# Patient Record
Sex: Male | Born: 1935 | Race: Black or African American | Hispanic: No | Marital: Single
Health system: Southern US, Community
[De-identification: ages and names within clinical notes are randomized; demographics above are authoritative.]

## PROBLEM LIST (undated history)

## (undated) DIAGNOSIS — I2510 Atherosclerotic heart disease of native coronary artery without angina pectoris: Secondary | ICD-10-CM

## (undated) DIAGNOSIS — G20 Parkinson's disease: Secondary | ICD-10-CM

## (undated) DIAGNOSIS — K219 Gastro-esophageal reflux disease without esophagitis: Secondary | ICD-10-CM

## (undated) DIAGNOSIS — R0681 Apnea, not elsewhere classified: Secondary | ICD-10-CM

## (undated) DIAGNOSIS — E785 Hyperlipidemia, unspecified: Secondary | ICD-10-CM

## (undated) DIAGNOSIS — N200 Calculus of kidney: Secondary | ICD-10-CM

## (undated) DIAGNOSIS — I10 Essential (primary) hypertension: Secondary | ICD-10-CM

## (undated) HISTORY — PX: CARDIAC CATHETERIZATION: SHX172

## (undated) HISTORY — PX: LITHOTRIPSY: SUR834

---

## 2004-05-27 ENCOUNTER — Ambulatory Visit: Payer: Self-pay | Admitting: Internal Medicine

## 2004-06-17 ENCOUNTER — Ambulatory Visit: Payer: Self-pay | Admitting: Internal Medicine

## 2004-07-10 ENCOUNTER — Ambulatory Visit: Payer: Self-pay | Admitting: Internal Medicine

## 2004-09-03 ENCOUNTER — Ambulatory Visit: Payer: Self-pay | Admitting: Internal Medicine

## 2005-01-01 ENCOUNTER — Ambulatory Visit: Payer: Self-pay | Admitting: Internal Medicine

## 2005-08-19 ENCOUNTER — Ambulatory Visit: Payer: Self-pay | Admitting: Internal Medicine

## 2005-10-09 ENCOUNTER — Ambulatory Visit: Payer: Self-pay | Admitting: Internal Medicine

## 2005-10-29 ENCOUNTER — Ambulatory Visit: Payer: Self-pay | Admitting: Pulmonary Disease

## 2005-11-05 ENCOUNTER — Ambulatory Visit: Payer: Self-pay | Admitting: Internal Medicine

## 2005-11-27 ENCOUNTER — Ambulatory Visit: Payer: Self-pay | Admitting: Internal Medicine

## 2006-01-29 ENCOUNTER — Ambulatory Visit: Payer: Self-pay | Admitting: Pulmonary Disease

## 2006-03-12 ENCOUNTER — Ambulatory Visit: Payer: Self-pay | Admitting: Internal Medicine

## 2006-04-22 ENCOUNTER — Ambulatory Visit: Payer: Self-pay

## 2006-05-25 ENCOUNTER — Ambulatory Visit (HOSPITAL_COMMUNITY): Admission: RE | Admit: 2006-05-25 | Discharge: 2006-05-25 | Payer: Self-pay | Admitting: Internal Medicine

## 2006-05-25 ENCOUNTER — Ambulatory Visit: Payer: Self-pay | Admitting: Internal Medicine

## 2006-05-25 LAB — CONVERTED CEMR LAB
Hemoglobin, Urine: NEGATIVE
Ketones, ur: NEGATIVE mg/dL
Leukocytes, UA: NEGATIVE
Mucus, UA: NEGATIVE
PSA: 0.44 ng/mL
Total Protein, Urine: NEGATIVE mg/dL
Urine Glucose: NEGATIVE mg/dL
Urobilinogen, UA: 0.2 (ref 0.0–1.0)

## 2006-05-28 ENCOUNTER — Ambulatory Visit: Payer: Self-pay | Admitting: Internal Medicine

## 2006-05-28 ENCOUNTER — Ambulatory Visit: Payer: Self-pay | Admitting: Cardiology

## 2006-06-08 ENCOUNTER — Encounter: Admission: RE | Admit: 2006-06-08 | Discharge: 2006-09-06 | Payer: Self-pay | Admitting: Internal Medicine

## 2006-06-15 ENCOUNTER — Ambulatory Visit: Payer: Self-pay | Admitting: Internal Medicine

## 2006-07-01 ENCOUNTER — Ambulatory Visit (HOSPITAL_BASED_OUTPATIENT_CLINIC_OR_DEPARTMENT_OTHER): Admission: RE | Admit: 2006-07-01 | Discharge: 2006-07-01 | Payer: Self-pay | Admitting: Pulmonary Disease

## 2006-07-10 ENCOUNTER — Ambulatory Visit: Payer: Self-pay | Admitting: Pulmonary Disease

## 2006-08-05 ENCOUNTER — Ambulatory Visit: Payer: Self-pay | Admitting: Internal Medicine

## 2006-10-14 ENCOUNTER — Ambulatory Visit: Payer: Self-pay | Admitting: Pulmonary Disease

## 2006-10-20 ENCOUNTER — Encounter: Payer: Self-pay | Admitting: Internal Medicine

## 2006-10-20 DIAGNOSIS — I10 Essential (primary) hypertension: Secondary | ICD-10-CM | POA: Insufficient documentation

## 2006-10-20 DIAGNOSIS — E669 Obesity, unspecified: Secondary | ICD-10-CM | POA: Insufficient documentation

## 2006-10-20 DIAGNOSIS — Z87898 Personal history of other specified conditions: Secondary | ICD-10-CM | POA: Insufficient documentation

## 2006-10-20 DIAGNOSIS — G4730 Sleep apnea, unspecified: Secondary | ICD-10-CM | POA: Insufficient documentation

## 2007-04-23 ENCOUNTER — Telehealth (INDEPENDENT_AMBULATORY_CARE_PROVIDER_SITE_OTHER): Payer: Self-pay | Admitting: *Deleted

## 2007-07-26 ENCOUNTER — Telehealth (INDEPENDENT_AMBULATORY_CARE_PROVIDER_SITE_OTHER): Payer: Self-pay | Admitting: *Deleted

## 2008-04-27 ENCOUNTER — Telehealth (INDEPENDENT_AMBULATORY_CARE_PROVIDER_SITE_OTHER): Payer: Self-pay | Admitting: *Deleted

## 2009-03-01 IMAGING — CR DG LUMBAR SPINE COMPLETE 4+V
5 series · 5 of 5 positions shown · non-contrast
Comparison: none

CLINICAL DATA: Severe low back pain for five days.
 LUMBAR SPINE ? 4 VIEW:

[view not recorded (1 of 5)]
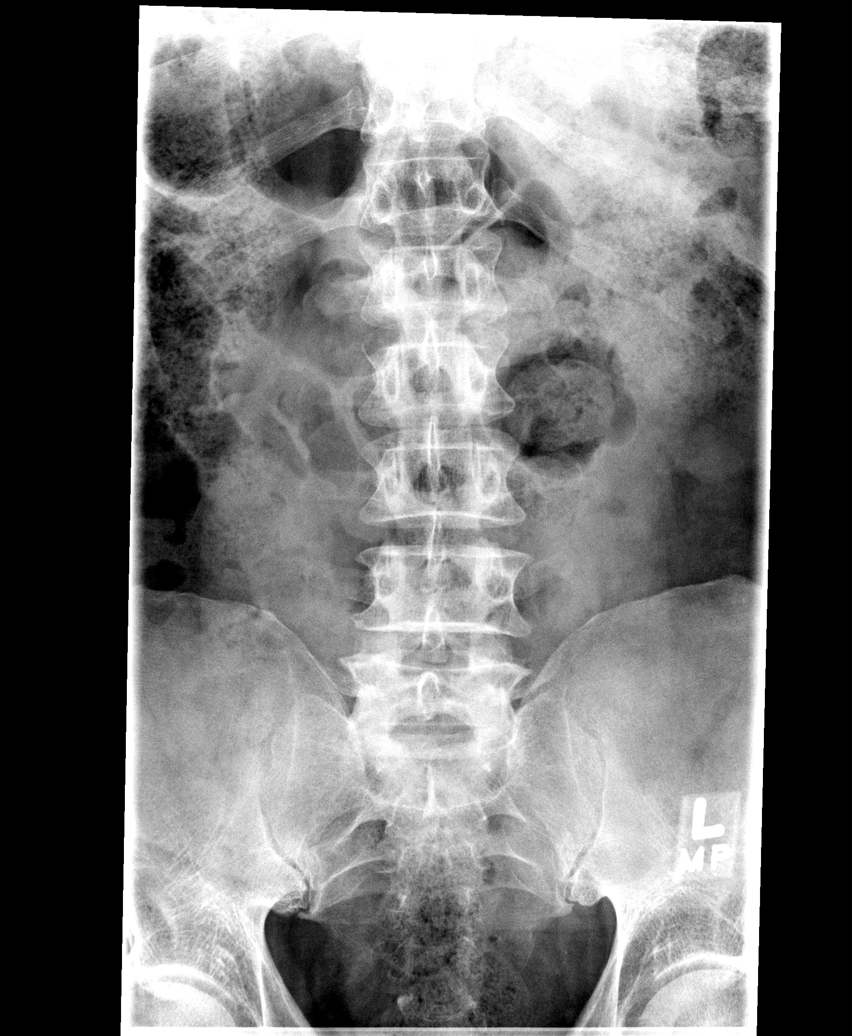

[view not recorded (2 of 5)]
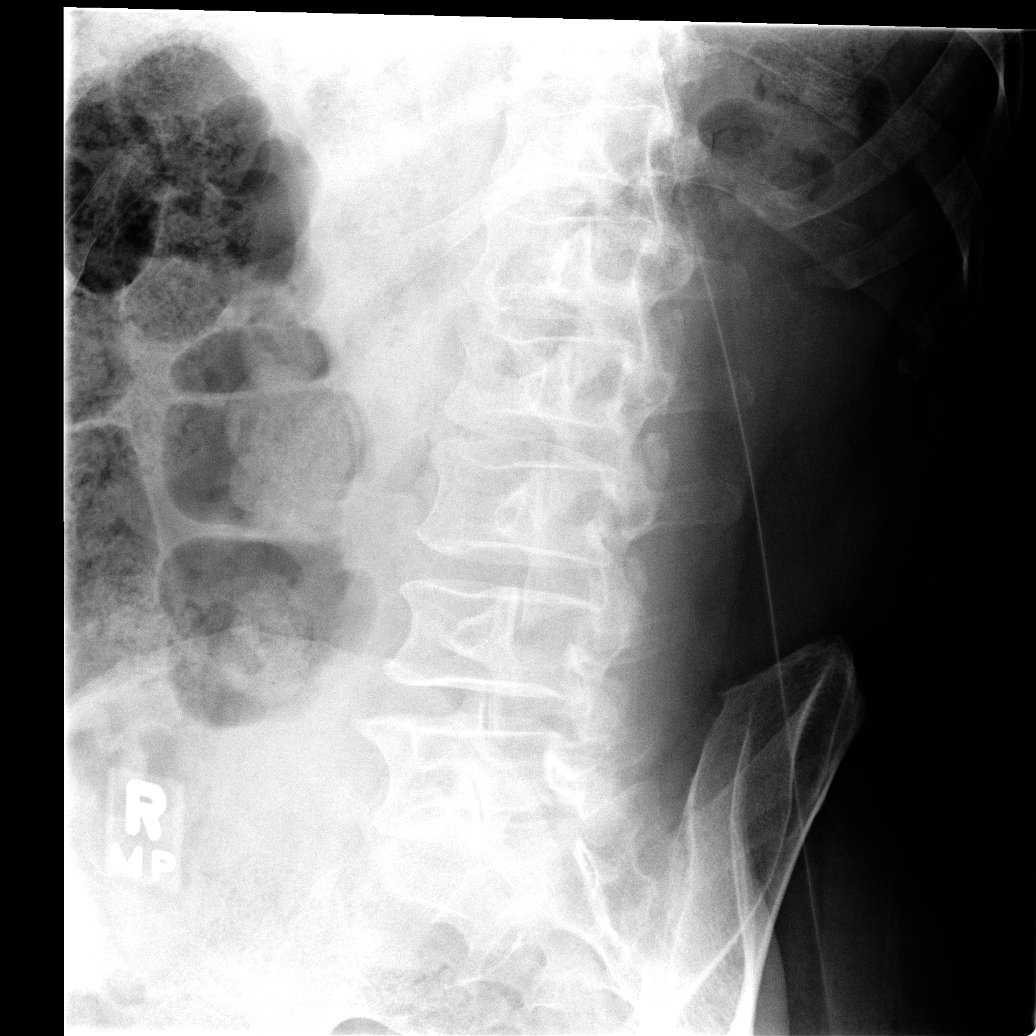

[view not recorded (3 of 5)]
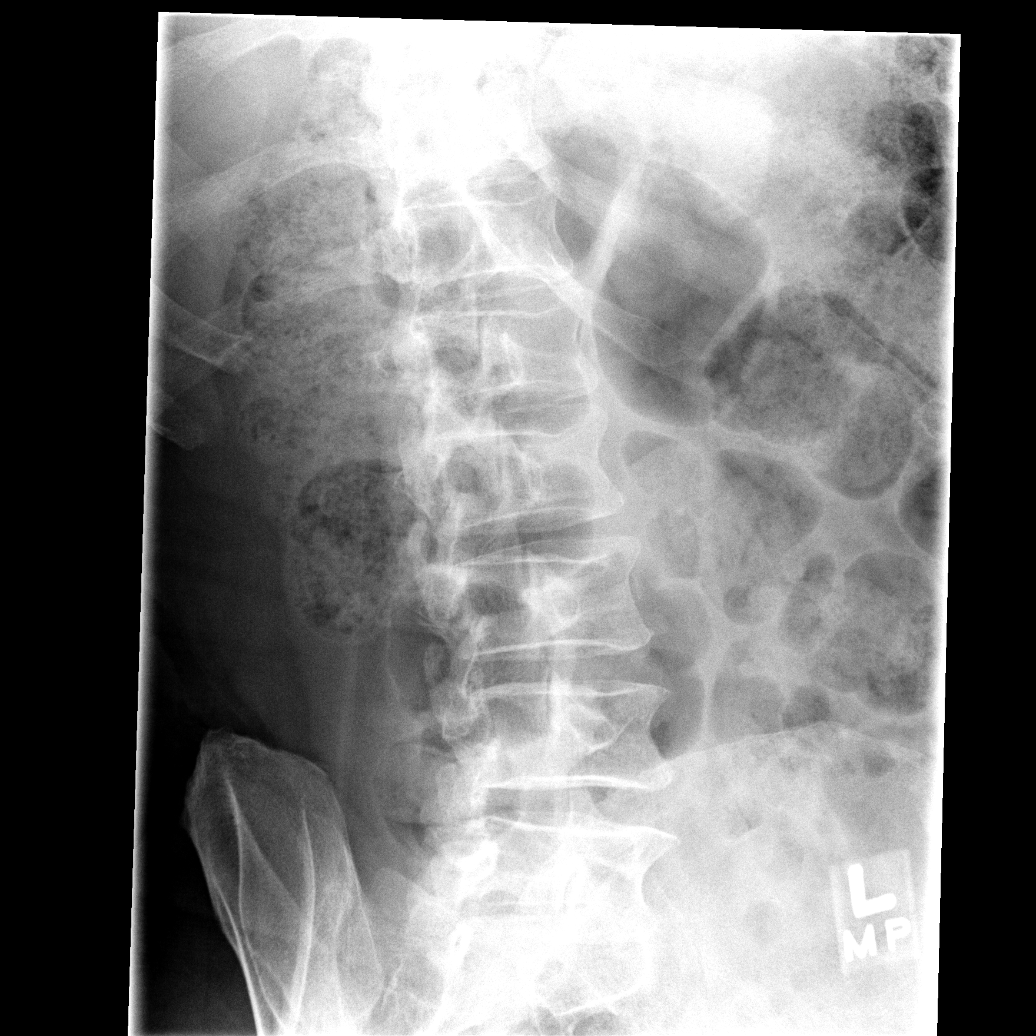

[view not recorded (4 of 5)]
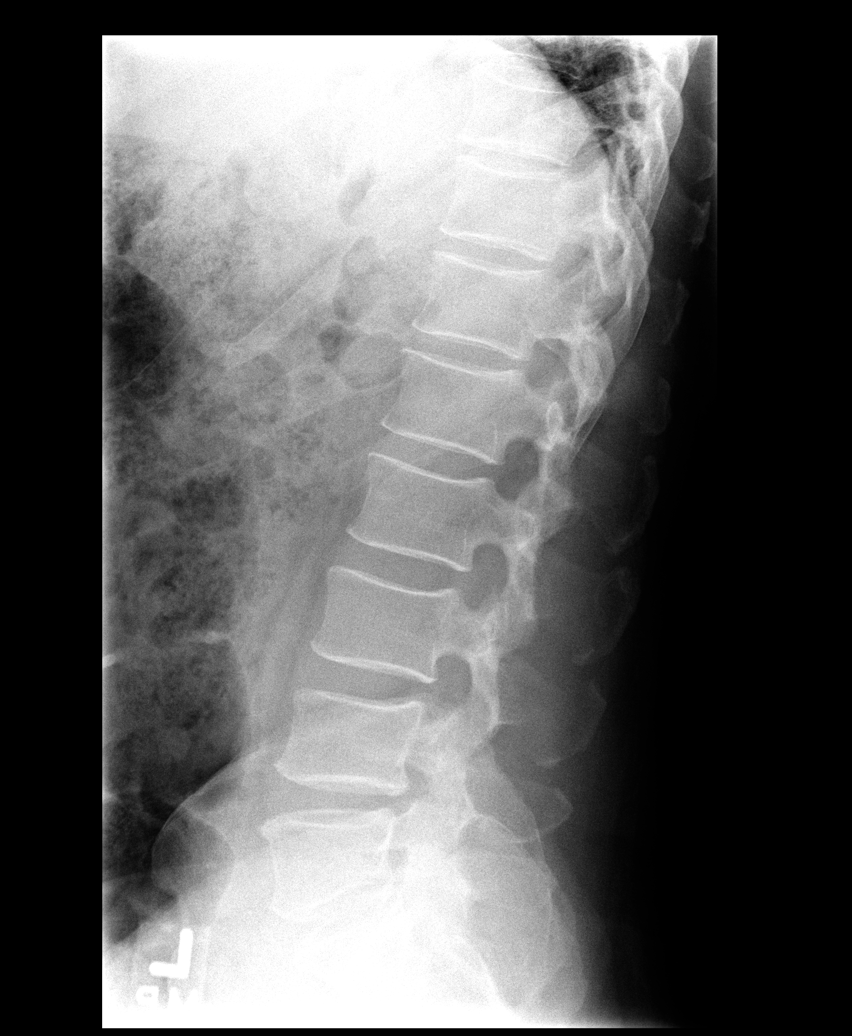

[view not recorded (5 of 5)]
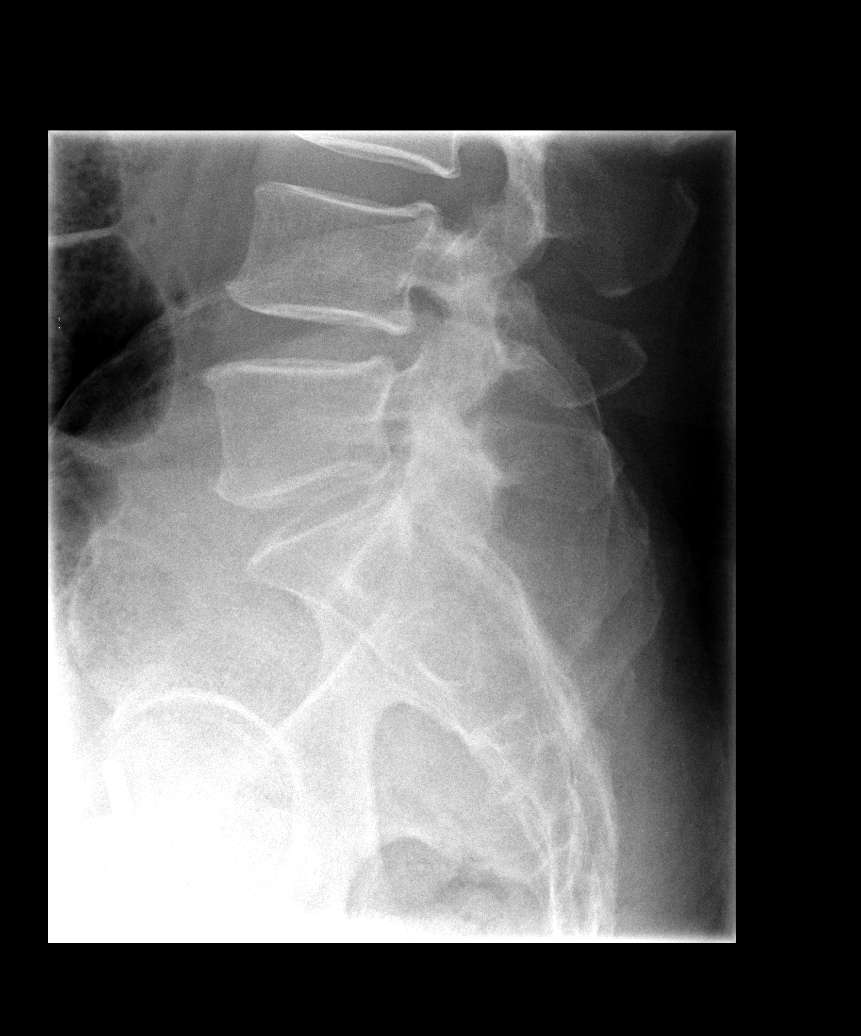

[5 of 5 positions shown; findings below may reference images not displayed]

FINDINGS: There are five typical lumbar segments with no spondylolisthesis, spondylolysis or significant disc space narrowing.  There is no fracture or subluxation.
IMPRESSION: Normal lumbar spine.

## 2009-04-16 ENCOUNTER — Encounter: Payer: Self-pay | Admitting: Pulmonary Disease

## 2009-11-29 ENCOUNTER — Encounter: Payer: Self-pay | Admitting: Pulmonary Disease

## 2010-03-22 ENCOUNTER — Encounter: Payer: Self-pay | Admitting: Pulmonary Disease

## 2010-04-23 NOTE — Letter (Signed)
Summary: CMN for PAP Supplies/[facility]  CMN for PAP Supplies/[facility]   Imported By: Sherian Rein 04/19/2009 08:59:48  _____________________________________________________________________  External Attachment:    Type:   Image     Comment:   External Document

## 2010-04-23 NOTE — Letter (Signed)
Summary: Denied CMN/[facility]  Denied CMN/[facility]   Imported By: Lester Weatogue 12/06/2009 12:01:46  _____________________________________________________________________  External Attachment:    Type:   Image     Comment:   External Document

## 2010-04-25 NOTE — Letter (Signed)
Summary: DME/[facility]  DME/[facility]   Imported By: Sherian Rein 04/03/2010 14:29:04  _____________________________________________________________________  External Attachment:    Type:   Image     Comment:   External Document

## 2010-08-06 NOTE — Assessment & Plan Note (Signed)
[facility]                            CARDIOLOGY OFFICE NOTE   Joseph Silva, Joseph Silva                         MRN:          [id number]  DATE:08/05/2006                            DOB:          14-Oct-1933    PRIMARY CARE PHYSICIAN:  Joseph Hair. Artist Pais, DO.   INTERVAL HISTORY:  [name] is a delightful 77 year old male with a  history of hypertension, hyperlipidemia, obesity, and sleep apnea, on  CPAP, who returns for routine followup.   Overall, he is doing quite well.  He denies any chest pain or shortness  of breath.  The main problem has been that he developed some left leg  pain which is concerning for his sciatica.  He has therefore been unable  to engage in his regular exercise routine.  For about the past two  months, he has been seeing a [facility].  Fortunately, the pain is  getting better.  He denies any orthopnea.  No PND.  His cholesterol has  been well controlled.   CURRENT MEDICATIONS:  1. Potassium.  2. Altace 10 a day.  3. Hydrochlorothiazide 25 a day.  4. Aspirin 325 a day.  5. Zocor 20 a day.  6. Viagra p.r.n.   PHYSICAL EXAMINATION:  GENERAL:  He is well-appearing.  Ambulates around  the clinic with a cane due to his left leg problem.  No respiratory  difficulty.  VITAL SIGNS:  Blood pressure 126/72, checked manually.  Heart rate is  76.  Weight is 227, which is down about 10 pounds from previous.  HEENT:  Normal.  NECK:  Supple.  No JVD.  Carotids are 2+ bilaterally without any bruits.  There is no lymphadenopathy or thyromegaly.  CARDIAC:  PMI is nondisplaced.  Regular rate and rhythm.  No murmurs,  rubs or gallops.  LUNGS:  Clear.  ABDOMEN:  Mildly obese, nontender, nondistended.  No hepatosplenomegaly.  No bruits.  No masses.  Good bowel sounds.  EXTREMITIES:  Warm with no clubbing, cyanosis or edema.  There is no  rash.  NEURO:  He is alert and oriented x3.  Cranial nerves II-XII are intact.  He moves all four  extremities without difficulty.  Affect is very  pleasant.   ASSESSMENT/PLAN:  1. Hypertension:  This is well controlled.  Continue current therapy.  2. Hyperlipidemia:  The most recent cholesterol panel looked great.      He was at goal.  3. Obesity:  He has lost about 10 pounds since we last saw him.  We      congratulated him and told him to continue with his exercise and      diet program.  4. Cardiovascular risk screening:  He had a Myoview in January, 2008      which showed an EF of 55% with no evidence of scar or ischemia.   DISPOSITION:  He is doing quite well.  Return to clinic in six months  for routine followup.     Bevelyn Buckles. Bensimhon, MD     DRB/MedQ  DD: 08/05/2006  DT: 08/05/2006  Job #: [id number]

## 2010-08-09 NOTE — Procedures (Signed)
NAME:  Joseph Silva, Joseph Silva NO.:  [id number]   MEDICAL RECORD NO.:  [id number]          PATIENT TYPE:  OUT   LOCATION:  SLEEP CENTER                 FACILITY:  [facility]   PHYSICIAN:  Barbaraann Share, MD,FCCPDATE OF BIRTH:  10/05/35   DATE OF STUDY:  07/01/2006                            NOCTURNAL POLYSOMNOGRAM   REFERRING PHYSICIAN:   INDICATIONS FOR STUDY:  Hypersomnia with sleep apnea.  The patient  returns for pressure optimization.   EPWORTH SLEEPINESS SCORE:  Is 11.   MEDICATIONS:   SLEEP ARCHITECTURE:  The patient had a total sleep time of 321 minutes  with significantly decreased REM and never achieved slow wave sleep.  Sleep onset latency was normal at 13.5 minutes and REM onset was  prolonged at 162 minutes.  Sleep efficiency was decreased at 80%.   RESPIRATORY DATA:  The patient underwent a CPAP titration with a large  Res Med Quattro full face mask.  The pressure was initiated at 5 cm of  water pressure and ultimately titrated to a pressure of 23 cm of water,  with very little apneas or hypopneas or snoring noted.  It did appear,  however, that his optimal pressure was somewhere between 20-22 cm of  water pressure.  The patient tolerated the titration quite well.   OXYGEN DATA:  There is O2 desaturation as low as 85% prior to CPAP  optimization.   CARDIAC DATA:  Occasional PAC was noted without clinical significance.   MOVEMENT-PARASOMNIA:  The patient was found to have small numbers of leg  jerks with no significant sleep disruption.   IMPRESSIONS-RECOMMENDATIONS:  1. Good control of previously documented obstructive sleep apnea with      a pressure of 20-22 cm of water.  A large      Res Med Quattro full face mask was used for the titration.  2. Occasional premature atrial contraction was noted, without clinical      significance.      Barbaraann Share, MD,FCCP  Diplomate, American Board of Sleep  Medicine  Electronically Signed     [facility]/MEDQ  D:  07/10/2006 19:12:10  T:  07/11/2006 17:06:05  Job:  [id number]

## 2010-08-09 NOTE — Assessment & Plan Note (Signed)
[facility]                               PULMONARY OFFICE NOTE   MAAZ, SPIERING                         MRN:          [id number]  DATE:10/29/2005                            DOB:          Jun 26, 1933    HISTORY OF PRESENT ILLNESS:  The patient is a 77 year old black male who is  being referred today for management of obstructive sleep apnea.  The patient  apparently was diagnosed with sleep apnea approximately 10-12 years ago and  was placed on CPAP that he used for a few years.  He was off for  approximately 2-3 years and then re-studied in [place] approximately 3 years  ago.  He  does not know the severity of his sleep apnea or his respiratory  disturbance index.  The patient was placed on CPAP and has been using this  intermittently most recently.  The patient has a CPAP machine that is 57-30  years old as well as a machine that is approximately 75 years old and he has  been using these interchangeably.  The patient uses a full face mask as well  as a nasal mask.  His last mask was obtained six months ago.  The patient  does not use a heated humidifier.  The patient states he wears it every  night, feels that it does help when he wears it consistently.  He  typically  goes to bed between 10:30 and 11:30 at night and gets up at 6 a.m. to start  his day.  Sometimes he is rested, others he is not.  The patient does note  some inappropriate sleepiness with periods of inactivity.  He has occasional  sleepiness with driving.  The patient also describes episodes at night where  he will jump out of bed quickly and will sometimes fall to the floor while  doing this.   PAST MEDICAL HISTORY:  1.  Hypertension.  2.  Dyslipidemia.  3.  History of obstructive sleep apnea.   MEDICATIONS:  1.  Hydrochlorothiazide 25 q.d.  2.  Potassium 20 mEq 5 tabs per day.  3.  Verapamil ER 300 q.d.  4.  Aspirin 325 q.d.  5.  Altace 2 mg q.d.  6.  Zocor 20 mg q.h.s.  7.   Famvir 250 q.d.   The patient has no known drug allergies.   SOCIAL HISTORY:  The patient is self-employed.  He has never smoked.  He is  currently divorced and has grown children.   FAMILY HISTORY:  Remarkable only for leukemia.   REVIEW OF SYSTEMS:  As per history of present illness.  Also see patient  intake form documented in the chart.   PHYSICAL EXAMINATION:  GENERAL:  He is an overweight  black male in no acute  distress.  VITAL SIGNS:  Blood pressure 118/74, pulse 70, temperature 97.9, weight is  229 pounds, O2 saturation on room air is 95%.  HEENT:  Pupils equal, round and reactive to light and accommodation.  Extraocular muscles are intact. Nares show deviated septum to the left.  Oropharynx shows elongation  of the soft palate as well as the uvula with a  large tongue.  NECK:  Supple without JVD or lymphadenopathy.  There is no palpable  thyromegaly.  CHEST:  Totally clear.  CARDIAC:  Regular rate and rhythm, no murmurs, rubs, or gallops.  ABDOMEN:  Soft and nontender with good bowel sounds.  GENITAL EXAM:  Rectal examination was not done and not indicated.  LOWER EXTREMITIES:  Without edema, good pulses distally, no calf tenderness.  NEUROLOGIC:  Alert and oriented with no obvious motor deficits.   IMPRESSION:  1.  Obstructive sleep apnea.  This has been a longstanding problem for the      patient and he has been on CPAP. Currently the nights he is wearing      CPAP, I suspect that his setup is not optimal.  Will certainly work on      having his machine checked and to make sure it is in working order.  He      will also need a heated humidifier in order to help with compliance.      The patient has been having increasing symptoms of light and I wonder if      his pressure is adequately set.  2.  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  The patient is having abnormal      behavior at night where he jumps out of bed and will sometimes fall and      hurt himself.  It is  unclear whether this is due to  poorly controlled      sleep disorder breathing or whether he may  have RBD.  Only time will      tell.   PLAN:  1.  Will get the patient re-fitted for a new CPAP mask since he is      complaining of the fit on the current one.  2.  The patient will receive and autotitrate device for two weeks with      downloads so that we can optimize his CPAP pressure.  3.  Will check his current machine and make sure it is in working order and      add a heated humidifier.  4.  Will work on weight loss.  5.  If the patient continues to have abnormal behaviors at night despite      adequate control of his obstructive sleep apnea, we will consider the      diagnosis of REM behavior disorder.                                   Barbaraann Share, MD, FCCP   [facility]/MedQ  DD:  10/29/2005  DT:  10/29/2005  Job #:  [id number]   cc:   Barbette Hair. Artist Pais, DO

## 2010-08-09 NOTE — Assessment & Plan Note (Signed)
[facility]                            CARDIOLOGY OFFICE NOTE   Joseph, Silva                         MRN:          [id number]  DATE:03/12/2006                            DOB:          [birth year]    Primary care physician:  Dr. Thomos Silva.   PATIENT IDENTIFICATION:  [name] is a delightful 77 year old male who  returns for routine followup here.   PROBLEM LIST:  1. Hypertension.  Difficult to control.  2. History of positive exercise treadmill test with negative perfusion      scans.  Last approximately 3 years ago.  3. Sleep apnea, on CPAP since 1998.  Just got a new machine.  4. Obesity.  5. Gastroesophageal reflux disease.  6. Erectile dysfunction.  7. Abnormal chest x-ray with followup chest CT which was normal.   CURRENT MEDICATIONS:  1. Valtrex 250 mg b.i.d.  2. Potassium.  3. Altace 10 mg a day.  4. Hydrochlorothiazide 25 a day.  5. Aspirin 325 a day.  6. Zocor 20 mg a day.  7. Viagra p.r.n.   INTERVAL HISTORY:  [name] returns today for routine followup.  He  has been doing fairly well, he denies any chest pain or shortness of  breath.  Unfortunately, due to an upper respiratory tract infection, he  has discontinued his exercise program.  He denies any orthopnea or PND.  Denies any lower extremity edema.   PHYSICAL EXAMINATION:  He is well-appearing.  He ambulates around the  clinic without any respiratory difficulty.  Blood pressure 126/82  manually with a heart rate of 69.  Weight is 236.  HEENT:  Sclerae anicteric. EOMI.  There are no xanthelasma.  Mucous  membranes are moist.  NECK:  Supple.  No jugular venous distension.  Carotids are 2+  bilaterally without any bruits.  There is no lymphadenopathy or  thyromegaly.  CARDIAC:  Regular rate and rhythm.  No murmurs, rubs, or gallops.  LUNGS:  Clear.  ABDOMEN:  Obese, nontender, nondistended.  No hepatosplenomegaly, no  bruits or masses appreciated.  Good bowel  sounds.  EXTREMITIES:  Warm with no cyanosis, clubbing, or edema.  NEURO:  Alert and oriented times 3.  Cranial nerves II-XII are intact.  Moves all 4 extremities without difficulty.  His affect is very  pleasant.   ASSESSMENT/PLAN:  1. Hypertension.  This is somewhat elevated today.  Previously had      been well-controlled.  I have asked him to keep a blood pressure      log over the next month, and forward it to me.  I suspect we may      need to add another agent such as Norvasc.  2. Hyperlipidemia.  He is due for a recheck.  We will continue Zocor.      I would like to get his LDL down under 100.  3. Obesity.  I have reminded him the need to continue with exercise      and diet to lose weight.  4. Obstructive sleep apnea.  Continue CPAP.  5. Cardiovascular risk training.  We will proceed with our screening      treadmill Myoview here in the near future to make sure he has not      developed any coronary disease.     Joseph Buckles. Bensimhon, MD  Electronically Signed    DRB/MedQ  DD: 03/12/2006  DT: 03/13/2006  Job #: [id number]   cc:   Joseph Silva. Joseph Pais, DO

## 2015-07-25 ENCOUNTER — Encounter (HOSPITAL_BASED_OUTPATIENT_CLINIC_OR_DEPARTMENT_OTHER): Payer: Self-pay | Admitting: *Deleted

## 2015-07-25 ENCOUNTER — Emergency Department (HOSPITAL_BASED_OUTPATIENT_CLINIC_OR_DEPARTMENT_OTHER)
Admission: EM | Admit: 2015-07-25 | Discharge: 2015-07-26 | Disposition: A | Discharge status: Home / Self Care | Payer: Medicare Other | Attending: Emergency Medicine | Admitting: Emergency Medicine

## 2015-07-25 DIAGNOSIS — G20 Parkinson's disease: Secondary | ICD-10-CM | POA: Insufficient documentation

## 2015-07-25 DIAGNOSIS — E785 Hyperlipidemia, unspecified: Secondary | ICD-10-CM | POA: Insufficient documentation

## 2015-07-25 DIAGNOSIS — I2510 Atherosclerotic heart disease of native coronary artery without angina pectoris: Secondary | ICD-10-CM | POA: Insufficient documentation

## 2015-07-25 DIAGNOSIS — Z79899 Other long term (current) drug therapy: Secondary | ICD-10-CM | POA: Diagnosis not present

## 2015-07-25 DIAGNOSIS — R531 Weakness: Secondary | ICD-10-CM | POA: Diagnosis not present

## 2015-07-25 DIAGNOSIS — Z7982 Long term (current) use of aspirin: Secondary | ICD-10-CM | POA: Diagnosis not present

## 2015-07-25 DIAGNOSIS — I10 Essential (primary) hypertension: Secondary | ICD-10-CM | POA: Insufficient documentation

## 2015-07-25 HISTORY — DX: Essential (primary) hypertension: I10

## 2015-07-25 HISTORY — DX: Parkinson's disease: G20

## 2015-07-25 HISTORY — DX: Hyperlipidemia, unspecified: E78.5

## 2015-07-25 HISTORY — DX: Apnea, not elsewhere classified: R06.81

## 2015-07-25 HISTORY — DX: Atherosclerotic heart disease of native coronary artery without angina pectoris: I25.10

## 2015-07-25 HISTORY — DX: Calculus of kidney: N20.0

## 2015-07-25 HISTORY — DX: Gastro-esophageal reflux disease without esophagitis: K21.9

## 2015-07-25 NOTE — ED Provider Notes (Signed)
CSN: [id number]     Arrival date & time 07/25/15  2313 History  By signing my name below, I, Dora Sims, attest that this documentation has been prepared under the direction and in the presence of physician practitioner, Shanon Rosser, MD. Electronically Signed: Dora Sims, Scribe. 07/25/2015. 11:42 PM.     Chief Complaint  Patient presents with  . Weakness    The history is provided by the patient and a relative. No language interpreter was used.     HPI Comments: SCHON Silva is a 82 y.o. male brought in by his family members with h/o Parkinson's, CAD, GERD, and HTN who presents to the Emergency Department complaining of generalized weakness for the last several days. Per his daughter, pt has experienced moderate difficulty ambulating and standing for the past several days due to weakness. Pt's daughter states that he has been sleeping more than usual since onset and his appetite has decreased; pt has eaten minimally today. Per his daughter, pt has also been experiencing wheezing, rhinorrhea, and cough since earlier this afternoon. Per his daughter, pt also endorses worsening of his chronic bilateral leg pain and right-sided jaw pain over the last few days. Pt's daughter states that pt was around paint fumes yesterday but he was in his room with his door closed; she is unsure if this is a cause of his wheezing. Pt denies nausea, vomiting, diarrhea, fever and chest pain. His PCP is Dr. Tamala Julian in [place] and his neurologist is Dr. Ellender Hose.  Past Medical History  Diagnosis Date  . Hypertension   . Parkinson disease ([place])   . Hyperlipemia   . GERD (gastroesophageal reflux disease)   . CAD (coronary artery disease)   . Kidney stones   . Apnea    Past Surgical History  Procedure Laterality Date  . Cardiac catheterization    . Lithotripsy     No family history on file. Social History  Substance Use Topics  . Smoking status: None  . Smokeless tobacco: None  . Alcohol Use: None     Review of Systems  A complete 10 system review of systems was obtained and all systems are negative except as noted in the HPI and PMH.   Allergies  Review of patient's allergies indicates not on file.  Home Medications   Prior to Admission medications   Medication Sig Start Date End Date Taking? Authorizing Provider  amLODipine (NORVASC) 10 MG tablet Take 10 mg by mouth daily.   Yes Historical Provider, MD  aspirin 81 MG chewable tablet Chew by mouth daily.   Yes Historical Provider, MD  atorvastatin (LIPITOR) 40 MG tablet Take 40 mg by mouth daily.   Yes Historical Provider, MD  clopidogrel (PLAVIX) 75 MG tablet Take 75 mg by mouth daily.   Yes Historical Provider, MD  lisinopril (PRINIVIL,ZESTRIL) 20 MG tablet Take 20 mg by mouth daily.   Yes Historical Provider, MD  metoprolol (LOPRESSOR) 50 MG tablet Take 50 mg by mouth 2 (two) times daily.   Yes Historical Provider, MD  pantoprazole (PROTONIX) 40 MG tablet Take 40 mg by mouth daily.   Yes Historical Provider, MD  potassium chloride (K-DUR) 10 MEQ tablet Take 10 mEq by mouth daily.   Yes Historical Provider, MD  rasagiline (AZILECT) 0.5 MG TABS tablet Take 0.5 mg by mouth daily.   Yes Historical Provider, MD  ropinirole (REQUIP) 5 MG tablet Take 5 mg by mouth at bedtime.   Yes Historical Provider, MD   BP 124/97  mmHg  Pulse 77  Temp(Src) 98.8 F (37.1 C) (Oral)  Resp 18  Ht 5\' 10"  (1.778 m)  Wt 220 lb (99.791 kg)  BMI 31.57 kg/m2  SpO2 93%   Physical Exam  General: Well-developed, well-nourished male in no acute distress; appearance consistent with age of record HENT: normocephalic; atraumatic Eyes: pupils equal, round and reactive to light; extraocular muscles intact; arcus senilis bilaterally Neck: supple Heart: regular rate and rhythm; no murmurs, rubs or gallops Lungs: clear to auscultation bilaterally; stridor in throat that transiently clears with cough Abdomen: soft; nondistended; nontender; no masses or  hepatosplenomegaly; bowel sounds present; reducible umbilical hernia Extremities: No deformity; full range of motion; pulses normal; trace edema of lower legs Neurologic: Awake, alert and oriented; motor function intact in all extremities and symmetric; no facial droop; upper extremity tremor consistent with Parkinson's disease Skin: Warm and dry Psychiatric: Normal mood and affect  ED Course  Procedures (including critical care time)  DIAGNOSTIC STUDIES: Oxygen Saturation is 93% on RA, adequate by my interpretation.    COORDINATION OF CARE: 11:42 PM Discussed treatment plan with pt and his family members at bedside and they agreed to plan.   MDM   Nursing notes and vitals signs, including pulse oximetry, reviewed.  Summary of this visit's results, reviewed by myself:   EKG Interpretation  Date/Time:  Wednesday Jul 25 2015 23:53:03 EDT Ventricular Rate:  66 PR Interval:  179 QRS Duration: 90 QT Interval:  418 QTC Calculation: 438 R Axis:   24 Text Interpretation:  Sinus rhythm Baseline wander in lead(s) V3 No previous ECGs available Confirmed by Stevey Stapleton  MD, Jenny Reichmann ([id number]) on 07/25/2015 11:55:17 PM       Labs:  Results for orders placed or performed during the hospital encounter of 07/25/15 (from the past 24 hour(s))  CBC with Differential/Platelet     Status: Abnormal   Collection Time: 07/26/15 12:10 AM  Result Value Ref Range   WBC 6.8 4.0 - 10.5 K/uL   RBC 4.66 4.22 - 5.81 MIL/uL   Hemoglobin 14.2 13.0 - 17.0 g/dL   HCT 41.7 39.0 - 52.0 %   MCV 89.5 78.0 - 100.0 fL   MCH 30.5 26.0 - 34.0 pg   MCHC 34.1 30.0 - 36.0 g/dL   RDW 14.1 11.5 - 15.5 %   Platelets 135 (L) 150 - 400 K/uL   Neutrophils Relative % 67 %   Lymphocytes Relative 22 %   Monocytes Relative 8 %   Eosinophils Relative 3 %   Basophils Relative 0 %   Band Neutrophils 0 %   Metamyelocytes Relative 0 %   Myelocytes 0 %   Promyelocytes Absolute 0 %   Blasts 0 %   nRBC 0 0 /100 WBC   Other 0 %    Neutro Abs 4.6 1.7 - 7.7 K/uL   Lymphs Abs 1.5 0.7 - 4.0 K/uL   Monocytes Absolute 0.5 0.1 - 1.0 K/uL   Eosinophils Absolute 0.2 0.0 - 0.7 K/uL   Basophils Absolute 0.0 0.0 - 0.1 K/uL   WBC Morphology ATYPICAL LYMPHOCYTES   Basic metabolic panel     Status: Abnormal   Collection Time: 07/26/15 12:10 AM  Result Value Ref Range   Sodium 140 135 - 145 mmol/L   Potassium 3.8 3.5 - 5.1 mmol/L   Chloride 104 101 - 111 mmol/L   CO2 28 22 - 32 mmol/L   Glucose, Bld 135 (H) 65 - 99 mg/dL   BUN 17 6 - 20  mg/dL   Creatinine, Ser 1.21 0.61 - 1.24 mg/dL   Calcium 9.3 8.9 - 10.3 mg/dL   GFR calc non Af Amer 55 (L) >60 mL/min   GFR calc Af Amer >60 >60 mL/min   Anion gap 8 5 - 15  Troponin I     Status: None   Collection Time: 07/26/15 12:10 AM  Result Value Ref Range   Troponin I <0.03 <0.031 ng/mL  Brain natriuretic peptide     Status: None   Collection Time: 07/26/15 12:10 AM  Result Value Ref Range   B Natriuretic Peptide 37.7 0.0 - 100.0 pg/mL  Urinalysis, Routine w reflex microscopic (not at [facility])     Status: None   Collection Time: 07/26/15  1:50 AM  Result Value Ref Range   Color, Urine YELLOW YELLOW   APPearance CLEAR CLEAR   Specific Gravity, Urine 1.024 1.005 - 1.030   pH 5.5 5.0 - 8.0   Glucose, UA NEGATIVE NEGATIVE mg/dL   Hgb urine dipstick NEGATIVE NEGATIVE   Bilirubin Urine NEGATIVE NEGATIVE   Ketones, ur NEGATIVE NEGATIVE mg/dL   Protein, ur NEGATIVE NEGATIVE mg/dL   Nitrite NEGATIVE NEGATIVE   Leukocytes, UA NEGATIVE NEGATIVE    Imaging Studies: Dg Chest 2 View  07/26/2015  CLINICAL DATA:  Sudden onset constant generalized weakness for several days. Decreased appetite. Wheezing, rhinorrhea, and cough this afternoon. EXAM: CHEST  2 VIEW COMPARISON:  09/19/2011 FINDINGS: Normal heart size and pulmonary vascularity. No focal airspace disease or consolidation in the lungs. No blunting of costophrenic angles. No pneumothorax. Mediastinal contours appear intact. Calcified  granuloma in the right mid lung. Tortuous aorta. Degenerative changes in the spine and shoulders. IMPRESSION: No active cardiopulmonary disease. Electronically Signed   By: Lucienne Capers M.D.   On: 07/26/2015 00:49   2:30 AM Patient able to ambulate without difficulty. There is no evidence of acute infection or cardiopulmonary disease at this time. He was given an albuterol inhaler for his dyspnea. I see no indication for hospital admission at this time.    I personally performed the services described in this documentation, which was scribed in my presence. The recorded information has been reviewed and is accurate.   Shanon Rosser, MD 07/26/15 [phone]

## 2015-07-25 NOTE — ED Notes (Signed)
Family member reports increased weakness generalized and difficulty ambulating and standing. Has should decline in ability to perform ADL's and decreased appetite over last several days. Has had increased pain right side faces but denies other pain. Has had increased expiratory wheezing and generalized weakness denies fever N/V/D but admits to increased SOB. Wheezing reassessed as stridor that vanishes with cough but returns shortly after. Lungs clear but slightly decreased bilat bases

## 2015-07-25 NOTE — ED Notes (Signed)
Family states increased weakness x 2 days

## 2015-07-26 ENCOUNTER — Emergency Department (HOSPITAL_BASED_OUTPATIENT_CLINIC_OR_DEPARTMENT_OTHER): Payer: Medicare Other

## 2015-07-26 DIAGNOSIS — R531 Weakness: Secondary | ICD-10-CM | POA: Diagnosis not present

## 2015-07-26 LAB — BASIC METABOLIC PANEL
ANION GAP: 8 (ref 5–15)
BUN: 17 mg/dL (ref 6–20)
CALCIUM: 9.3 mg/dL (ref 8.9–10.3)
CO2: 28 mmol/L (ref 22–32)
Chloride: 104 mmol/L (ref 101–111)
Creatinine, Ser: 1.21 mg/dL (ref 0.61–1.24)
GFR calc Af Amer: >60 mL/min (ref >60)
GFR, EST NON AFRICAN AMERICAN: 55 mL/min — AB (ref >60)
GLUCOSE: 135 mg/dL — AB (ref 65–99)
POTASSIUM: 3.8 mmol/L (ref 3.5–5.1)
Sodium: 140 mmol/L (ref 135–145)

## 2015-07-26 LAB — URINALYSIS, ROUTINE W REFLEX MICROSCOPIC
Bilirubin Urine: NEGATIVE
GLUCOSE, UA: NEGATIVE mg/dL
HGB URINE DIPSTICK: NEGATIVE
Ketones, ur: NEGATIVE mg/dL
LEUKOCYTES UA: NEGATIVE
Nitrite: NEGATIVE
PH: 5.5 (ref 5.0–8.0)
PROTEIN: NEGATIVE mg/dL
Specific Gravity, Urine: 1.024 (ref 1.005–1.030)

## 2015-07-26 LAB — CBC WITH DIFFERENTIAL/PLATELET
BLASTS: 0 %
Band Neutrophils: 0 %
Basophils Absolute: 0 10*3/uL (ref 0.0–0.1)
Basophils Relative: 0 %
EOS PCT: 3 %
Eosinophils Absolute: 0.2 10*3/uL (ref 0.0–0.7)
HEMATOCRIT: 41.7 % (ref 39.0–52.0)
Hemoglobin: 14.2 g/dL (ref 13.0–17.0)
LYMPHS ABS: 1.5 10*3/uL (ref 0.7–4.0)
LYMPHS PCT: 22 %
MCH: 30.5 pg (ref 26.0–34.0)
MCHC: 34.1 g/dL (ref 30.0–36.0)
MCV: 89.5 fL (ref 78.0–100.0)
MONOS PCT: 8 %
Metamyelocytes Relative: 0 %
Monocytes Absolute: 0.5 10*3/uL (ref 0.1–1.0)
Myelocytes: 0 %
NEUTROS ABS: 4.6 10*3/uL (ref 1.7–7.7)
NEUTROS PCT: 67 %
NRBC: 0 /100{WBCs}
Other: 0 %
PLATELETS: 135 10*3/uL — AB (ref 150–400)
Promyelocytes Absolute: 0 %
RBC: 4.66 MIL/uL (ref 4.22–5.81)
RDW: 14.1 % (ref 11.5–15.5)
WBC: 6.8 10*3/uL (ref 4.0–10.5)

## 2015-07-26 LAB — TROPONIN I: Troponin I: <0.03 ng/mL (ref <0.031)

## 2015-07-26 LAB — BRAIN NATRIURETIC PEPTIDE: B Natriuretic Peptide: 37.7 pg/mL (ref 0.0–100.0)

## 2015-07-26 MED ORDER — ALBUTEROL SULFATE HFA 108 (90 BASE) MCG/ACT IN AERS
2.0000 | INHALATION_SPRAY | Freq: Once | RESPIRATORY_TRACT | Status: AC
  Administered 2015-07-26: 2 via RESPIRATORY_TRACT
  Filled 2015-07-26: qty 6.7

## 2018-05-02 IMAGING — DX DG CHEST 2V
2 series · 2 of 2 positions shown · non-contrast
Comparison: 09/19/2011

CLINICAL DATA: Sudden onset constant generalized weakness for
several days. Decreased appetite. Wheezing, rhinorrhea, and cough
this afternoon.

EXAM:
CHEST  2 VIEW

[chest lat]
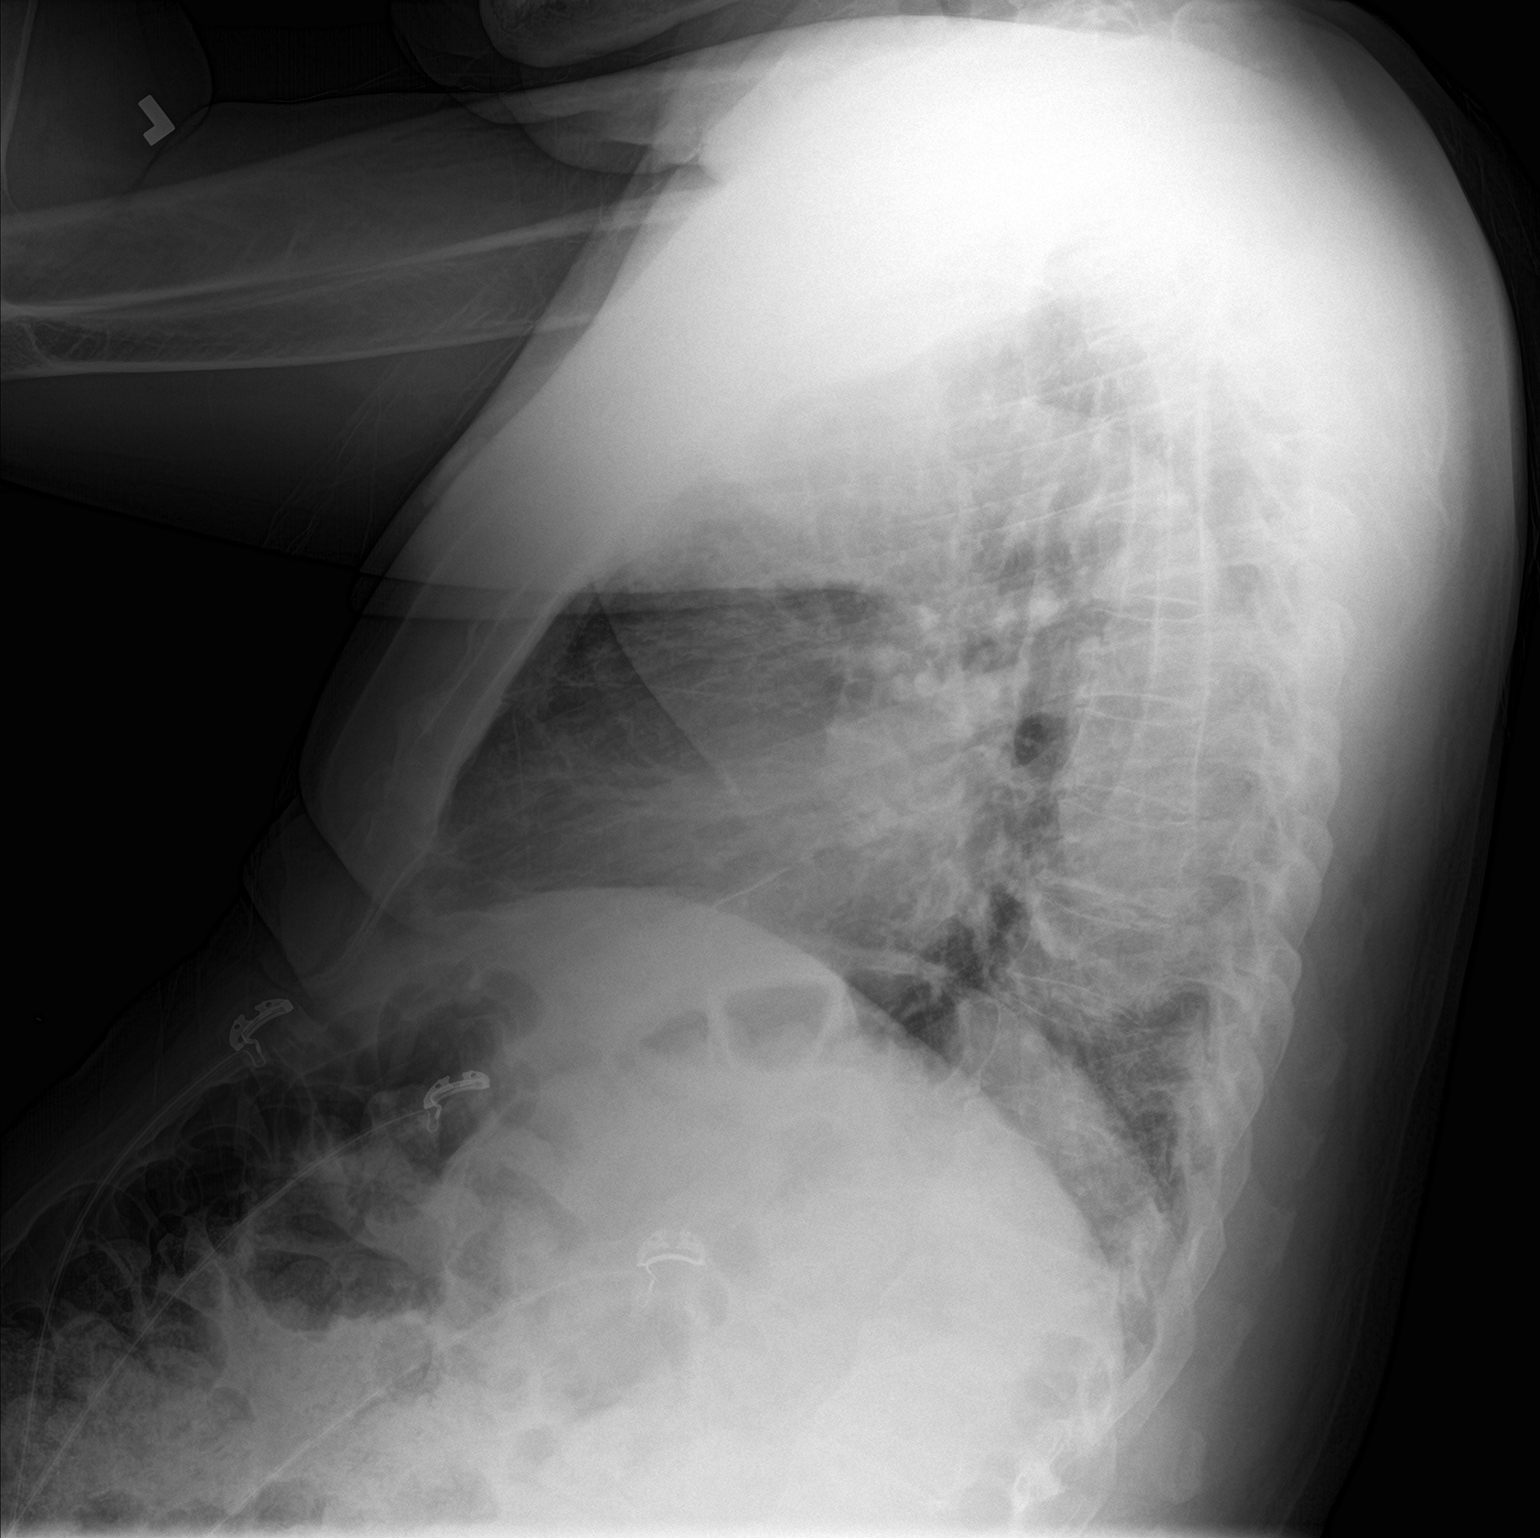

[chest ap]
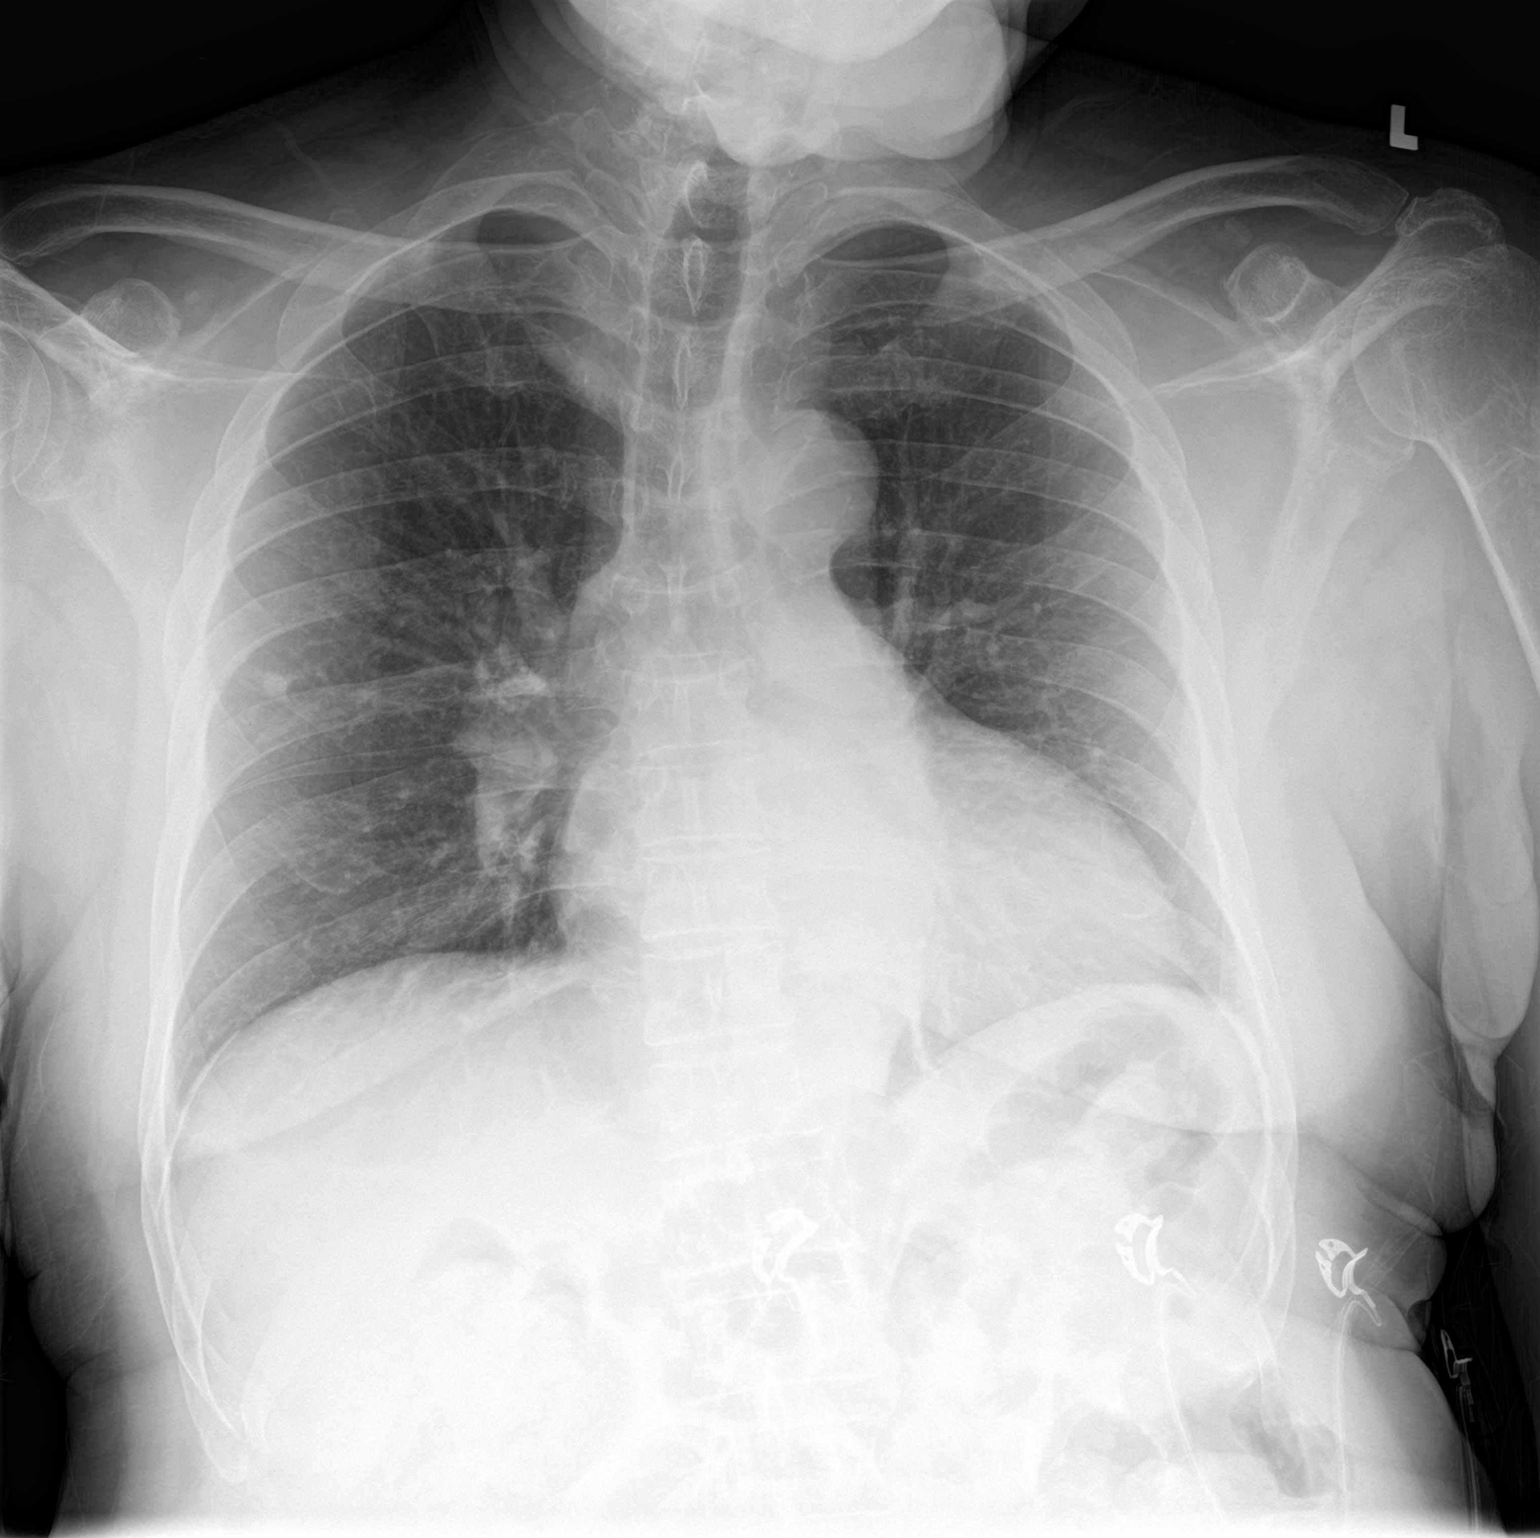

[2 of 2 positions shown; findings below may reference images not displayed]

FINDINGS: Normal heart size and pulmonary vascularity. No focal airspace
disease or consolidation in the lungs. No blunting of costophrenic
angles. No pneumothorax. Mediastinal contours appear intact.
Calcified granuloma in the right mid lung. Tortuous aorta.
Degenerative changes in the spine and shoulders.
IMPRESSION: No active cardiopulmonary disease.

## 2021-07-05 ENCOUNTER — Telehealth: Payer: Self-pay

## 2021-07-05 NOTE — Telephone Encounter (Signed)
Spoke with patient's caregiver Festus Holts and scheduled a mychart Palliative Consult for 07/08/21 @ 2 PM.  ? ?Consent obtained; updated Netsmart, Team List and Epic.  ? ?

## 2021-07-08 ENCOUNTER — Telehealth: Payer: Medicare Other | Admitting: Hospice

## 2021-07-08 DIAGNOSIS — I10 Essential (primary) hypertension: Secondary | ICD-10-CM

## 2021-07-08 DIAGNOSIS — R531 Weakness: Secondary | ICD-10-CM

## 2021-07-08 DIAGNOSIS — D3611 Benign neoplasm of peripheral nerves and autonomic nervous system of face, head, and neck: Secondary | ICD-10-CM

## 2021-07-08 DIAGNOSIS — Z515 Encounter for palliative care: Secondary | ICD-10-CM

## 2021-07-08 DIAGNOSIS — G4730 Sleep apnea, unspecified: Secondary | ICD-10-CM

## 2021-07-08 DIAGNOSIS — G20 Parkinson's disease: Secondary | ICD-10-CM

## 2021-07-08 NOTE — Progress Notes (Signed)
? ? ?Manufacturing engineer ?Community Palliative Care Consult Note ?Telephone: [phone]  ?Fax: [phone] ? ?PATIENT NAME: Joseph Silva ?[address] ?[place] [address] ?[phone] (home)  ?DOB: 16-Aug-1933 ?MRN: [id number] ? ?PRIMARY CARE PROVIDER:    ?No primary care provider on file.,  ?No primary provider on file. ?None ? ?REFERRING PROVIDER:   ?Jeannie Done, MD ?[address] ?[address] ?[place],  [place] [address] ?[phone] ? ?RESPONSIBLE PARTY:   Self ?Live-in Caregiver: Vanessa [place] [phone] best # to call ?Contact Information   ? ? Name Relation Home Work Mobile  ? Makani, Seckman  [phone]    ? Effrey, Davidow Spouse   [phone]  ? ?  ? ? ?TELEHEALTH VISIT STATEMENT ?Due to the COVID-19 crisis, this visit was done via telemedicine from my office and it was initiated and consent by this patient and or family.  ?I connected with patient OR PROXY by a telephone/video  and verified that I am speaking with the correct person. I discussed the limitations of evaluation and management by telemedicine. The patient expressed understanding and agreed to proceed. ?Palliative Care was asked to follow this patient to address advance care planning, complex medical decision making and goals of care clarification. Festus Holts is home with patient during visit. This is the initial visit.  ? ?  ASSESSMENT AND / RECOMMENDATIONS:  ? ?Advance Care Planning: Our advance care planning conversation included a discussion about:    ?The value and importance of advance care planning  ?Difference between Hospice and Palliative care ?Exploration of goals of care in the event of a sudden injury or illness  ?Identification and preparation of a healthcare agent  ?Review and updating or creation of an  advance directive document . ?Decision not to resuscitate or to de-escalate disease focused treatments due to poor prognosis. ? ?CODE STATUS: Discussion on implications of code status. Patient  affirmed he is a Full code.   ? ?Goals of Care: Goals include to maximize quality of life and symptom management ? ?I spent 16  minutes providing this initial consultation. More than 50% of the time in this consultation was spent on counseling patient and coordinating communication. ?-------------------------------------------------------------------------------------------------------------------------------------- ? ?Symptom Management/Plan: ?Weakness: Recently discharged from physical therapy - last week. Patient is max assist for transfers, hoyer lift sometimes.  ?Parkinson's disease: Managed with rasagiline.  Followed by Neurologist. ?Hypertension: Managed prn Hydralazine.  ?Routine CBC CMP. ?Schwanomma of neck: no plan for surgery. Neck pain is managed with Tylenol, Lyrica. ?Sleep Apnea: Continue BIPAP at night and during day time naps.  ? ? ?Follow up: Palliative care will continue to follow for complex medical decision making, advance care planning, and clarification of goals. Return 6 weeks or prn. Encouraged to call provider sooner with any concerns.  ? ?Family /Caregiver/Community Supports: Patient lives at home with his caregiver Festus Holts.  Family is involved in care.  Strong family support system identified.  Patient received honorary doctoral degree from different universities, per caregiver Festus Holts ? ?HOSPICE ELIGIBILITY/DIAGNOSIS: TBD ? ?Chief Complaint: Initial Palliative care visit ? ?HISTORY OF PRESENT ILLNESS:  Joseph Silva is a 88 y.o. year old male  with multiple morbidities requiring close monitoring and with high risk of complications and  mortality: Parkinson disease, sleep apnea, schwannoma of neck, hypertension.  Patient in no acute distress, denies pain/discomfort. ?History obtained from review of EMR, discussion with primary team, caregiver, family and/or Mr. Stai.  ?Review and summarization of Epic records shows history from other  than patient. Rest of 10 point ROS asked and negative.   ?Independent interpretation of tests and reviewed as needed, available labs, patient records, imaging, studies and related documents from the EMR. ? ? ?ROS ?General: NAD ?EYES: denies vision changes ?ENMT: denies dysphagia ?Cardiovascular: denies chest pain/discomfort ?Pulmonary: denies cough, denies SOB ?Abdomen: endorses good appetite, denies constipation/diarrhea ?GU: denies dysuria, urinary frequency ?MSK:  endorses weakness,  no falls reported ?Skin: denies rashes or wounds ?Neurological: denies pain, denies insomnia ?Psych: Endorses positive mood ?Heme/lymph/immuno: denies bruises, abnormal bleeding ? ? ?PAST MEDICAL HISTORY:  ?Active Ambulatory Problems  ?  Diagnosis Date Noted  ? OBESITY NOS 10/20/2006  ? HYPERTENSION 10/20/2006  ? SLEEP APNEA 10/20/2006  ? GENITAL HERPES, HX OF 10/20/2006  ? ?Resolved Ambulatory Problems  ?  Diagnosis Date Noted  ? No Resolved Ambulatory Problems  ? ?Past Medical History:  ?Diagnosis Date  ? Apnea   ? CAD (coronary artery disease)   ? GERD (gastroesophageal reflux disease)   ? Hyperlipemia   ? Hypertension   ? Kidney stones   ? Parkinson disease ([place])   ? ? ?SOCIAL HX:  ?Social History  ? ?Tobacco Use  ? Smoking status: Not on file  ? Smokeless tobacco: Not on file  ?Substance Use Topics  ? Alcohol use: Not on file  ? ?  ?FAMILY HX: No family history on file.   ? ?ALLERGIES: Not on File   ? ?PERTINENT MEDICATIONS:  ?Outpatient Encounter Medications as of 07/08/2021  ?Medication Sig  ? amLODipine (NORVASC) 10 MG tablet Take 10 mg by mouth daily.  ? aspirin 81 MG chewable tablet Chew by mouth daily.  ? atorvastatin (LIPITOR) 40 MG tablet Take 40 mg by mouth daily.  ? clopidogrel (PLAVIX) 75 MG tablet Take 75 mg by mouth daily.  ? lisinopril (PRINIVIL,ZESTRIL) 20 MG tablet Take 20 mg by mouth daily.  ? metoprolol (LOPRESSOR) 50 MG tablet Take 50 mg by mouth 2 (two) times daily.  ? pantoprazole (PROTONIX) 40 MG tablet Take 40 mg by mouth daily.  ? potassium chloride (K-DUR) 10  MEQ tablet Take 10 mEq by mouth daily.  ? rasagiline (AZILECT) 0.5 MG TABS tablet Take 0.5 mg by mouth daily.  ? ropinirole (REQUIP) 5 MG tablet Take 5 mg by mouth at bedtime.  ? ?No facility-administered encounter medications on file as of 07/08/2021.  ? ? ?Thank you for the opportunity to participate in the care of Mr. Brannen.  The palliative care team will continue to follow. Please call our office at [phone] if we can be of additional assistance.  ? ?Note: Portions of this note were generated with Lobbyist. Dictation errors may occur despite best attempts at proofreading. ? ?Teodoro Spray, NP  ? ?  ?

## 2021-08-01 ENCOUNTER — Other Ambulatory Visit: Payer: Medicare Other | Admitting: Hospice

## 2021-08-01 DIAGNOSIS — D3611 Benign neoplasm of peripheral nerves and autonomic nervous system of face, head, and neck: Secondary | ICD-10-CM

## 2021-08-01 DIAGNOSIS — R531 Weakness: Secondary | ICD-10-CM

## 2021-08-01 DIAGNOSIS — Z515 Encounter for palliative care: Secondary | ICD-10-CM

## 2021-08-01 DIAGNOSIS — E118 Type 2 diabetes mellitus with unspecified complications: Secondary | ICD-10-CM

## 2021-08-01 DIAGNOSIS — F0390 Unspecified dementia without behavioral disturbance: Secondary | ICD-10-CM

## 2021-08-01 DIAGNOSIS — G20 Parkinson's disease: Secondary | ICD-10-CM

## 2021-08-01 NOTE — Progress Notes (Signed)
? ? ?Manufacturing engineer ?Community Palliative Care Consult Note ?Telephone: [phone]  ?Fax: [phone] ? ?PATIENT NAME: Joseph Silva ?[address] ?[place] [address] ?[phone] (home)  ?DOB: 03-18-1934 ?MRN: [id number] ? ?PRIMARY CARE PROVIDER:    ?Dr. Sarajane Jews [place] ?[facility] ? ?REFERRING PROVIDER:   ?Dr Maryella Shivers [place] ? ?RESPONSIBLE PARTY:   Joseph Silva is the [facility]CP[facility] ?Live-in Caregiver: Vanessa [place] [phone] best # to call ?Contact Information   ? ? Name Relation Home Work Mobile  ? Temitayo, [place]alt  [phone]    ? Golden, Gilreath Spouse   [phone]  ? ?  ? ? ?Face to face visit with patient.  ?Palliative Care was asked to follow this patient to address advance care planning, complex medical decision making and goals of care clarification. Festus Holts is home with patient during visit. NP called Joseph Silva who provided additional history due to patient with cognitive impairment.  This is a follow up visit.  ? ?  ASSESSMENT AND / RECOMMENDATIONS:  ? ?Advance Care Planning: Our advance care planning conversation included a discussion about:    ? ?CODE STATUS: Extensive discussion on ramifications and implications of CODE STATUS.  Joseph Silva affirms that patient is a partial code-he wishes to have chest compressions with ACLS medications.  No mechanical intubation/ventilation ?Goals of Care: Goals include to maximize quality of life and symptom management ? ?Symptom Management/Plan: ?Weakness: Plan is to commence physical therapy rehab at [facility].  Patient is max assist for transfers, hoyer lift sometimes. Fall precautions discussed.  ?Parkinson's disease: Managed with [place]/levodapa , Ropinirol for tremors. Followed by Neurologist. ?Constipation: Managed with Miralax, Lactulose and Metamucil.  ?Dementia: Memory loss/confusion in line with dementia disease trajectory.  FAST 6D.  Continue Namenda and donepezil as ordered.  Continue ongoing  supportive care. ?Type 2 DM: A1c March 2023 is 5.8. Continue Metformin as ordered. Diabetic diet, no concentrated sweets.  ?FU visit with PCP 08/05/21, with Neurologist 08/28/21.  ?Schwanomma of neck: no plan for surgery. Gets Botox injection to neck  from Neurologist every three months. Patient also has Tylenol, Lyrica. ?Sleep Apnea: Continue BIPAP at night and during day time naps.  ? ? ?Follow up: Palliative care will continue to follow for complex medical decision making, advance care planning, and clarification of goals. Return 6 weeks or prn. Encouraged to call provider sooner with any concerns.  ? ?Family /Caregiver/Community Supports: Patient lives at home with his caregiver Festus Holts.  Family is involved in care, takes patient to doctor's appointments.  Joseph Silva is the [facility] and is involved in patient's care.  Strong family support system identified.  Patient received honorary doctoral degree from different universities, per caregiver Festus Holts ? ?HOSPICE ELIGIBILITY/DIAGNOSIS: TBD ? ?Chief Complaint: Follow up visit ? ?HISTORY OF PRESENT ILLNESS:  Joseph Silva is a 88 y.o. year old male  with multiple morbidities requiring close monitoring and with high risk of complications and  mortality: Parkinson disease, Dementia, sleep apnea, schwannoma of neck, hypertension, insomnia.  Patient in no acute distress, denies pain/discomfort. ?History obtained from review of EMR, discussion with primary team, caregiver, family and/or Mr. Sigmon.  ?Review and summarization of Epic records shows history from other than patient. Rest of 10 point ROS asked and negative.  ?Independent interpretation of tests and reviewed as needed, available labs, patient records, imaging, studies and related documents from the EMR. ? ? ?ROS ?General: NAD ?EYES: denies vision changes ?ENMT: denies dysphagia ?Cardiovascular: denies chest pain/discomfort ?Pulmonary: denies cough, denies SOB ?  Abdomen: endorses good appetite, denies constipation/diarrhea ?GU:  denies dysuria, urinary frequency ?MSK:  endorses weakness,  no falls reported ?Neurological: denies pain, denies insomnia ?Psych: Endorses positive mood ?Heme/lymph/immuno: denies bruises, abnormal bleeding ? ?Physical Exam ?Constitutional: NAD FLACC 0 ?General: Well groomed, cooperative ?EYES: anicteric sclera, lids intact, no discharge  ?ENMT: Moist mucous membrane ?CV: S1 S2, RRR, no LE edema ?Pulmonary: LCTA, no increased work of breathing, no cough, ?Abdomen: active BS + 4 quadrants, soft and non tender ?GU: no suprapubic tenderness ?MSK: weakness, non ambulatory limited ROM ?Skin: warm and dry, no rashes or wounds on visible skin ?Neuro:  weakness, otherwise non focal, confusion/memory loss ?Psych: non-anxious affect ?Hem/lymph/immuno: no widespread bruising ? ? ?PAST MEDICAL HISTORY:  ?Active Ambulatory Problems  ?  Diagnosis Date Noted  ? OBESITY NOS 10/20/2006  ? Essential hypertension 10/20/2006  ? Sleep apnea 10/20/2006  ? GENITAL HERPES, HX OF 10/20/2006  ? ?Resolved Ambulatory Problems  ?  Diagnosis Date Noted  ? No Resolved Ambulatory Problems  ? ?Past Medical History:  ?Diagnosis Date  ? Apnea   ? CAD (coronary artery disease)   ? GERD (gastroesophageal reflux disease)   ? Hyperlipemia   ? Hypertension   ? Kidney stones   ? Parkinson disease ([place])   ? ? ?SOCIAL HX:  ?Social History  ? ?Tobacco Use  ? Smoking status: Not on file  ? Smokeless tobacco: Not on file  ?Substance Use Topics  ? Alcohol use: Not on file  ? ?  ?FAMILY HX: No family history on file.   ? ?ALLERGIES: Not on File   ? ?PERTINENT MEDICATIONS:  ?Outpatient Encounter Medications as of 08/01/2021  ?Medication Sig  ? amLODipine (NORVASC) 10 MG tablet Take 10 mg by mouth daily.  ? aspirin 81 MG chewable tablet Chew by mouth daily.  ? atorvastatin (LIPITOR) 40 MG tablet Take 40 mg by mouth daily.  ? clopidogrel (PLAVIX) 75 MG tablet Take 75 mg by mouth daily.  ? lisinopril (PRINIVIL,ZESTRIL) 20 MG tablet Take 20 mg by mouth daily.  ?  metoprolol (LOPRESSOR) 50 MG tablet Take 50 mg by mouth 2 (two) times daily.  ? pantoprazole (PROTONIX) 40 MG tablet Take 40 mg by mouth daily.  ? potassium chloride (K-DUR) 10 MEQ tablet Take 10 mEq by mouth daily.  ? rasagiline (AZILECT) 0.5 MG TABS tablet Take 0.5 mg by mouth daily.  ? ropinirole (REQUIP) 5 MG tablet Take 5 mg by mouth at bedtime.  ? ?No facility-administered encounter medications on file as of 08/01/2021.  ? ?I spent 60 minutes providing this consultation; this includes time spent with patient/family, chart review and documentation. More than 50% of the time in this consultation was spent on counseling and coordinating communication  ? ?Thank you for the opportunity to participate in the care of Mr. Thurman.  The palliative care team will continue to follow. Please call our office at [phone] if we can be of additional assistance.  ? ?Note: Portions of this note were generated with Lobbyist. Dictation errors may occur despite best attempts at proofreading. ? ?Teodoro Spray, NP  ? ?  ?

## 2021-08-12 ENCOUNTER — Telehealth: Payer: Medicare Other | Admitting: Hospice

## 2021-08-19 ENCOUNTER — Telehealth: Payer: Medicare Other | Admitting: Hospice

## 2021-08-19 ENCOUNTER — Telehealth: Payer: Self-pay | Admitting: Hospice

## 2021-08-19 DIAGNOSIS — Z515 Encounter for palliative care: Secondary | ICD-10-CM

## 2021-08-19 NOTE — Telephone Encounter (Signed)
Np called patient/Joseph Silva for scheduled telehealth visit. Festus Holts cancelled explaining that patient/family will call with need. She reports patient is doing well with no change in condition.

## 2022-05-23 DEATH — deceased
